# Patient Record
Sex: Male | Born: 1970 | Race: Black or African American | Hispanic: No | Marital: Single | Smoking: Current every day smoker
Health system: Southern US, Community
[De-identification: ages and names within clinical notes are randomized; demographics above are authoritative.]

---

## 1999-09-22 ENCOUNTER — Emergency Department (HOSPITAL_COMMUNITY): Admission: EM | Admit: 1999-09-22 | Discharge: 1999-09-22 | Payer: Self-pay | Admitting: Emergency Medicine

## 2007-03-27 ENCOUNTER — Inpatient Hospital Stay (HOSPITAL_COMMUNITY): Admission: EM | Admit: 2007-03-27 | Discharge: 2007-03-28 | Payer: Self-pay | Admitting: Emergency Medicine

## 2010-01-19 ENCOUNTER — Ambulatory Visit: Payer: Self-pay | Admitting: Internal Medicine

## 2010-04-06 ENCOUNTER — Ambulatory Visit: Payer: Self-pay | Admitting: Family Medicine

## 2010-04-06 ENCOUNTER — Encounter (INDEPENDENT_AMBULATORY_CARE_PROVIDER_SITE_OTHER): Payer: Self-pay | Admitting: Adult Health

## 2010-04-06 LAB — CONVERTED CEMR LAB
Amphetamine Screen, Ur: NEGATIVE
Benzodiazepines.: NEGATIVE
Cocaine Metabolites: NEGATIVE
Marijuana Metabolite: POSITIVE — AB
Methadone: NEGATIVE

## 2010-04-20 ENCOUNTER — Ambulatory Visit: Payer: Self-pay | Admitting: Internal Medicine

## 2010-04-20 ENCOUNTER — Encounter (INDEPENDENT_AMBULATORY_CARE_PROVIDER_SITE_OTHER): Payer: Self-pay | Admitting: Adult Health

## 2010-04-20 LAB — CONVERTED CEMR LAB
ALT: 15 units/L (ref 0–53)
AST: 28 units/L (ref 0–37)
Alkaline Phosphatase: 41 units/L (ref 39–117)
BUN: 17 mg/dL (ref 6–23)
Barbiturate Quant, Ur: NEGATIVE
CO2: 21 meq/L (ref 19–32)
Chloride: 108 meq/L (ref 96–112)
Creatinine, Ser: 0.94 mg/dL (ref 0.40–1.50)
Creatinine,U: 206.3 mg/dL
HDL: 54 mg/dL (ref >39)
LDL Cholesterol: 98 mg/dL (ref 0–99)
Methadone: NEGATIVE
Opiate Screen, Urine: NEGATIVE
Propoxyphene: NEGATIVE
Sodium: 139 meq/L (ref 135–145)
Testosterone: 520.34 ng/dL (ref 350–890)
Total CHOL/HDL Ratio: 3.1

## 2011-05-10 NOTE — H&P (Signed)
NAMEDONTAVION, NOXON NO.:  [id number]   MEDICAL RECORD NO.:  [id number]          PATIENT TYPE:  EMS   LOCATION:  [facility]JO                         FACILITY:  [facility]   PHYSICIAN:  Lonia Blood, M.D.DATE OF BIRTH:  1971-06-19   DATE OF ADMISSION:  03/27/2007  DATE OF DISCHARGE:                              HISTORY & PHYSICAL   PRIMARY CARE PHYSICIAN:  Unassigned.   CHIEF COMPLAINT:  Chest pain with shortness of breath.   HISTORY OF PRESENT ILLNESS:  [name] is an otherwise  healthy 40 year old gentleman who admits to occasional cocaine abuse,  occasional marijuana abuse, and a one pack per week tobacco abuse  history.  He has no prior history of hospitalizations, surgeries,  diabetes, hypertension, hyperlipidemia.  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  While carrying a load of logs, he  began to experience left sided pressure type chest pain.  This radiated  down the left shoulder into the arm and elbow.  It was associated with  shortness of breath and diaphoresis.  He stopped what he was doing and  his symptoms resolved.  He admitted to using cocaine approximately 12  hours prior to this.  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  This was associated with diaphoresis and shortness of  breath.  The patient states his symptoms resolved.  He called his  grandmother, described his symptoms, and she explained to him that he  needed to report to the emergency room for evaluation.  In the emergency  room, he has been pain free.  Point-of-care cardiac markers have been  negative x1.  EKG is non-acute with early repolarization changes.   REVIEW OF SYSTEMS:  A comprehensive review of systems is unremarkable  with the exception of positive elements noted in the  history of present  illness above.   PAST MEDICAL HISTORY:  1. Tobacco abuse in the amount of one pack per week since age 65.  2. Marijuana abuse in the amount of one joint per week on average.  3. Cocaine abuse in the amount of approximately 1/2 inch line snorted      every month intermittently.   MEDICATIONS:  None.   ALLERGIES:  NO KNOWN DRUG ALLERGIES.   FAMILY HISTORY:  The patient's father is alive and well but has  diabetes.  The patient's mother deceased at an early age secondary to  lupus.  The patient has a brother and a sister, neither of which have  coronary disease.   SOCIAL HISTORY:  The patient does not drink alcohol, lives in  [place], has one healthy child, is single and works as a Administrator.  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   DATA REVIEW:  VITAL SIGNS:  Temperature 99.1, blood pressure 118/58,  heart rate 67, respiratory rate 16, O2 sat is 98% on room air.  Basic  metabolic panel was unremarkable.  Point-of-care cardiac  markers x1 are  negative.  Chest x-ray reveals no acute disease.  A 12-lead EKG reveals  normal sinus rhythm at 65 beats per minute with evidence of early  repolarization abnormalities.   PHYSICAL EXAMINATION:  VITAL SIGNS:  Temperature 99.1, blood pressure  118/58, heart rate 67, respiratory rate 16, O2 sat is 98% on room air.  GENERAL:  Well-developed, well-nourished male in no acute respiratory  distress.  HEENT:  Normocephalic atraumatic.  Pupils are equal, round, and reactive  to light and accommodation.  Extraocular muscles intact bilaterally.  OC/OP clear.  NECK:  No JVD.  LUNGS:  Clear to auscultation bilaterally without wheezing or rhonchi.  CARDIOVASCULAR:  Regular rate and rhythm without murmur, gallop or rub.  Normal S1 and S2.  ABDOMEN:  Nontender, nondistended, soft.  Bowel sounds present.  No  hepatosplenomegaly.  No rebound.  No ascites.  EXTREMITIES:  No significant cyanosis, clubbing, or edema bilateral   lower extremities.  NEUROLOGIC:  Nonfocal neurologic exam.   IMPRESSION AND PLAN:  1. Chest pain.  Symptoms are consistent with a cocaine-associated      chest pain.  I have counseled the patient extensively as to the      direct connection between cocaine abuse and his chest pain.  I have      explained to him the high risk of acute myocardial infarction or      acute stroke in the setting of ongoing cocaine abuse.  He agrees to      immediate abstinence from cocaine from this point forward.  Given      the description of his symptoms, I do feel that 24-hour observation      is appropriate.  We will cycle cardiac enzymes and check serial      EKGs.  If his rule out is negative, I do not feel that he has      sufficient risk factors apart from cocaine to necessitate further      risk stratification.  2. Tobacco abuse.  I have counseled the patient extensively of the      multiple deleterious effects of ongoing tobacco abuse.  He agrees      to discontinue smoking.  We will request tobacco cessation      consultation to further encourage this point.  3. Marijuana abuse.  In kind, I have advised the patient that      marijuana abuse is bad for his health in multiple different ways.      I have advised him to discontinue its use.  He reports that he will      do so immediately.      Lonia Blood, M.D.  Electronically Signed     JTM/MEDQ  D:  03/27/2007  T:  03/27/2007  Job:  [id number]

## 2012-01-20 NOTE — FLOWSHEETS (Signed)
°

## 2012-05-01 ENCOUNTER — Emergency Department (HOSPITAL_COMMUNITY): Payer: Self-pay

## 2012-05-01 ENCOUNTER — Encounter (HOSPITAL_COMMUNITY): Payer: Self-pay | Admitting: Emergency Medicine

## 2012-05-01 ENCOUNTER — Emergency Department (HOSPITAL_COMMUNITY)
Admission: EM | Admit: 2012-05-01 | Discharge: 2012-05-01 | Disposition: A | Discharge status: Home / Self Care | Payer: Self-pay | Attending: Emergency Medicine | Admitting: Emergency Medicine

## 2012-05-01 DIAGNOSIS — Y9289 Other specified places as the place of occurrence of the external cause: Secondary | ICD-10-CM | POA: Insufficient documentation

## 2012-05-01 DIAGNOSIS — S93402A Sprain of unspecified ligament of left ankle, initial encounter: Secondary | ICD-10-CM

## 2012-05-01 DIAGNOSIS — S93409A Sprain of unspecified ligament of unspecified ankle, initial encounter: Secondary | ICD-10-CM | POA: Insufficient documentation

## 2012-05-01 DIAGNOSIS — X500XXA Overexertion from strenuous movement or load, initial encounter: Secondary | ICD-10-CM | POA: Insufficient documentation

## 2012-05-01 MED ORDER — IBUPROFEN 800 MG PO TABS: 800.0000 mg | ORAL_TABLET | Freq: Three times a day (TID) | ORAL | Status: AC

## 2012-05-01 MED ORDER — HYDROCODONE-ACETAMINOPHEN 5-500 MG PO TABS: 1.0000 | ORAL_TABLET | Freq: Four times a day (QID) | ORAL | Status: AC | PRN

## 2012-05-01 MED ORDER — OXYCODONE-ACETAMINOPHEN 5-325 MG PO TABS
2.0000 | ORAL_TABLET | Freq: Once | ORAL | Status: AC
  Administered 2012-05-01: 2 via ORAL
  Filled 2012-05-01: qty 2

## 2012-05-01 NOTE — ED Notes (Signed)
Pt slipped off of curb yesterday while trying to catch a bus and twisted left ankle. He reports "I heard it pop" and "it went flat on the ground" while pointing to ankle. Left foot and ankle pain reported. 9/10.

## 2012-05-01 NOTE — Discharge Instructions (Signed)
Wear ankle brace for at least 2 weeks for stabilization of ankle. Use crutches as needed for comfort. Ice and elevate ankle throughout the day. Alternate between ibuprofen and hydrocodone-acetaminophen for pain relief. Do not drive or operate machinery with hydrocodone-acetaminophen use. Call orthopedic follow up today or tomorrow to schedule followup appointment for recheck of ongoing ankle pain in the next 1-2 weeks that can be canceled with a 24-48 hour notice if complete resolution of pain.  Ankle Sprain An ankle sprain is an injury to the strong, fibrous tissues (ligaments) that hold the bones of your ankle joint together.  CAUSES Ankle sprain usually is caused by a fall or by twisting your ankle. People who participate in sports are more prone to these types of injuries.  SYMPTOMS  Symptoms of ankle sprain include:  Pain in your ankle. The pain may be present at rest or only when you are trying to stand or walk.   Swelling.   Bruising. Bruising may develop immediately or within 1 to 2 days after your injury.   Difficulty standing or walking.  DIAGNOSIS  Your caregiver will ask you details about your injury and perform a physical exam of your ankle to determine if you have an ankle sprain. During the physical exam, your caregiver will press and squeeze specific areas of your foot and ankle. Your caregiver will try to move your ankle in certain ways. An X-ray exam may be done to be sure a bone was not broken or a ligament did not separate from one of the bones in your ankle (avulsion).  TREATMENT  Certain types of braces can help stabilize your ankle. Your caregiver can make a recommendation for this. Your caregiver may recommend the use of medication for pain. If your sprain is severe, your caregiver may refer you to a surgeon who helps to restore function to parts of your skeletal system (orthopedist) or a physical therapist. HOME CARE INSTRUCTIONS  Apply ice to your injury for 1 to 2 days  or as directed by your caregiver. Applying ice helps to reduce inflammation and pain.  Put ice in a plastic bag.   Place a towel between your skin and the bag.   Leave the ice on for 15 to 20 minutes at a time, every 2 hours while you are awake.   Take over-the-counter or prescription medicines for pain, discomfort, or fever only as directed by your caregiver.   Keep your injured leg elevated, when possible, to lessen swelling.   If your caregiver recommends crutches, use them as instructed. Gradually, put weight on the affected ankle. Continue to use crutches or a cane until you can walk without feeling pain in your ankle.   If you have a plaster splint, wear the splint as directed by your caregiver. Do not rest it on anything harder than a pillow the first 24 hours. Do not put weight on it. Do not get it wet. You may take it off to take a shower or bath.   You may have been given an elastic bandage to wear around your ankle to provide support. If the elastic bandage is too tight (you have numbness or tingling in your foot or your foot becomes cold and blue), adjust the bandage to make it comfortable.   If you have an air splint, you may blow more air into it or let air out to make it more comfortable. You may take your splint off at night and before taking a shower or bath.  Wiggle your toes in the splint several times per day if you are able.  SEEK MEDICAL CARE IF:   You have an increase in bruising, swelling, or pain.   Your toes feel cold.   Pain relief is not achieved with medication.  SEEK IMMEDIATE MEDICAL CARE IF: Your toes are numb or blue or you have severe pain. MAKE SURE YOU:   Understand these instructions.   Will watch your condition.   Will get help right away if you are not doing well or get worse.  Document Released: 12/09/2005 Document Revised: 11/28/2011 Document Reviewed: 07/13/2008 [facility] Patient Information 2012 [place], [place].

## 2012-05-01 NOTE — ED Provider Notes (Signed)
History     CSN: [id number]  Arrival date & time 05/01/12  1228   First MD Initiated Contact with Patient 05/01/12 1236      No chief complaint on file.   (Consider location/radiation/quality/duration/timing/severity/associated sxs/prior treatment) HPI  Patient presents to ER complaining of left ankle injury that happened yesterday when he twisted ankle stepping off a curb and heard a "pop". Patient states that since injury increasing swelling and pain with pain aggravated by weight bearing. Denies additional injury or break in skin. patient has taken nothing for pain PTA. Denies extremity numbness or tingling. Patient states he has no known medical problems and takes no meds on regular basis.   History reviewed. No pertinent past medical history.  History reviewed. No pertinent past surgical history.  No family history on file.  History  Substance Use Topics  . Smoking status: Current Everyday Smoker  . Smokeless tobacco: Not on file  . Alcohol Use: Yes      Review of Systems  Musculoskeletal: Positive for joint swelling.  Skin: Negative for color change and wound.  Neurological: Negative for numbness.    Allergies  Review of patient's allergies indicates no known allergies.  Home Medications   Current Outpatient Rx  Name Route Sig Dispense Refill  . ASPIRIN EC 81 MG PO TBEC Oral Take 243 mg by mouth once.    Marland Kitchen HYDROCODONE-ACETAMINOPHEN 5-500 MG PO TABS Oral Take 1-2 tablets by mouth every 6 (six) hours as needed for pain. 15 tablet 0  . IBUPROFEN 800 MG PO TABS Oral Take 1 tablet (800 mg total) by mouth 3 (three) times daily. Take 800mg  by mouth at breakfast, lunch and dinner for the next 5 days 21 tablet 0    BP 118/73  Temp 98.8 F (37.1 C)  Resp 18  SpO2 98%  Physical Exam  Nursing note and vitals reviewed. Constitutional: He is oriented to person, place, and time. He appears well-developed.  HENT:  Head: Normocephalic and atraumatic.  Eyes:  Conjunctivae are normal.  Neck: Normal range of motion. Neck supple.  Cardiovascular: Normal rate.   Pulmonary/Chest: Effort normal.  Musculoskeletal: He exhibits edema.       TTP of entire ankle with soft tissue swelling of ankle and decreased ROM due to pain. No breaks in skin. Good pedal pulse and cap refill. No TTP of forefoot or toes. Normal sensation of entire LLE. No TTP of calf or knee.   Neurological: He is alert and oriented to person, place, and time.  Skin: Skin is warm and dry. No rash noted. No erythema. No pallor.    ED Course  Procedures (including critical care time)  PO percocet  Labs Reviewed - No data to display Dg Ankle Complete Left  05/01/2012  *RADIOLOGY REPORT*  Clinical Data: Injury  LEFT ANKLE COMPLETE - 3+ VIEW  Comparison: None.  Findings: Prominent soft tissue swelling over the lateral malleolus.  Small bony density adjacent to the medial malleolus is present compatible with a avulsion fracture of indeterminate age. Degenerative changes in the ankle joint.  IMPRESSION: Soft tissue swelling over the lateral malleolus.  Small avulsion fracture from the medial malleolus of indeterminate age.  Original Report Authenticated By: Donavan Burnet, M.D.     1. Left ankle sprain     ASO and crutches given by ortho tech   MDM  LLE neuro vasc intact with no acute findings on ankle xray but likely sprain given mechanism of injury. Good pedal pulse. Entire forefoot  non tender. Patient to follow up with ortho as needed. Denies additional injury.         Jenness Corner, [place] 05/01/12 1520

## 2012-05-04 NOTE — ED Provider Notes (Signed)
Medical screening examination/treatment/procedure(s) were performed by non-physician practitioner and as supervising physician I was immediately available for consultation/collaboration.  Geoffery Lyons, MD 05/04/12 [phone]

## 2013-05-02 ENCOUNTER — Encounter (HOSPITAL_COMMUNITY): Payer: Self-pay | Admitting: *Deleted

## 2013-05-02 ENCOUNTER — Emergency Department (HOSPITAL_COMMUNITY)
Admission: EM | Admit: 2013-05-02 | Discharge: 2013-05-02 | Disposition: A | Discharge status: Home / Self Care | Payer: Self-pay | Attending: Emergency Medicine | Admitting: Emergency Medicine

## 2013-05-02 DIAGNOSIS — S61209A Unspecified open wound of unspecified finger without damage to nail, initial encounter: Secondary | ICD-10-CM | POA: Insufficient documentation

## 2013-05-02 DIAGNOSIS — Z23 Encounter for immunization: Secondary | ICD-10-CM | POA: Insufficient documentation

## 2013-05-02 DIAGNOSIS — S61012A Laceration without foreign body of left thumb without damage to nail, initial encounter: Secondary | ICD-10-CM

## 2013-05-02 DIAGNOSIS — W268XXA Contact with other sharp object(s), not elsewhere classified, initial encounter: Secondary | ICD-10-CM | POA: Insufficient documentation

## 2013-05-02 DIAGNOSIS — F17200 Nicotine dependence, unspecified, uncomplicated: Secondary | ICD-10-CM | POA: Insufficient documentation

## 2013-05-02 DIAGNOSIS — Y9389 Activity, other specified: Secondary | ICD-10-CM | POA: Insufficient documentation

## 2013-05-02 DIAGNOSIS — Y9289 Other specified places as the place of occurrence of the external cause: Secondary | ICD-10-CM | POA: Insufficient documentation

## 2013-05-02 DIAGNOSIS — Y990 Civilian activity done for income or pay: Secondary | ICD-10-CM | POA: Insufficient documentation

## 2013-05-02 MED ORDER — TETANUS-DIPHTH-ACELL PERTUSSIS 5-2.5-18.5 LF-MCG/0.5 IM SUSP
0.5000 mL | Freq: Once | INTRAMUSCULAR | Status: AC
  Administered 2013-05-02: 0.5 mL via INTRAMUSCULAR
  Filled 2013-05-02: qty 0.5

## 2013-05-02 NOTE — ED Notes (Signed)
Pt c/o LAC to left thumb approximately 1 inch and 1/4. Cut by broken glass bottle. Last tetanus greater than 5 years. Can wiggle digit, sensation present, cap refill less than 2 seconds, skin warm.

## 2013-05-02 NOTE — ED Notes (Signed)
The pt has a 1" laceration to the lt thumb when he lifted a plastic bag at workj that had a broken bottle inside it .  The bottle came through the bag and lacerated his finger and he cannot get the bleeding to stop.  Laceration cleaned with soap and water.  He can flex and extend his thumb without difficulty

## 2013-05-02 NOTE — ED Provider Notes (Signed)
History     CSN: [id number]  Arrival date & time 05/02/13  0124   First MD Initiated Contact with Patient 05/02/13 0136      Chief Complaint  Patient presents with   Extremity Laceration   HPI  History provided by the patient. Patient is a 42 year old male with no significant PMH who presents with laceration to his left thumb. Patient was at work emptying trash bags at [employer] and while tying the bag his left thumb hit a broken beer bottle causing a laceration. Patient had immediate bleeding and pain. Pain is slightly improved however he continues to have bleeding. He rinsed the cut with soap and water. He also used some Band-Aids over the thumb without significant improvement. Denies any other injuries or complaints. There is no weakness or numbness to the thumb. Denies any reduced range of motion. he is unsure of his last tetanus.    History reviewed. No pertinent past medical history.  History reviewed. No pertinent past surgical history.  No family history on file.  History  Substance Use Topics   Smoking status: Current Every Day Smoker   Smokeless tobacco: Not on file   Alcohol Use: Yes      Review of Systems  Neurological: Negative for weakness and numbness.  All other systems reviewed and are negative.    Allergies  Review of patient's allergies indicates no known allergies.  Home Medications   Current Outpatient Rx  Name  Route  Sig  Dispense  Refill   ibuprofen (ADVIL,MOTRIN) 200 MG tablet   Oral   Take 200 mg by mouth every 6 (six) hours as needed for pain.           BP 130/89   Pulse 62   Temp(Src) 97.8 F (36.6 C)   Resp 18   SpO2 99%  Physical Exam  Nursing note and vitals reviewed. Constitutional: He is oriented to person, place, and time. He appears well-developed and well-nourished. No distress.  HENT:  Head: Normocephalic.  Cardiovascular: Normal rate and regular rhythm.   Pulmonary/Chest: Effort normal and breath sounds  normal.  Musculoskeletal: Normal range of motion.  Superficial laceration with flap over the dorsal left thumb. No deep structure or tendon involvement through full range of motion. There is normal strength against resistance in all directions. Normal medial and lateral sensations to the distal tip of the finger. Normal cap refill less than 2 seconds.  Neurological: He is alert and oriented to person, place, and time.  Skin: Skin is warm.  Psychiatric: He has a normal mood and affect. His behavior is normal.    ED Course  Procedures   LACERATION REPAIR Performed by: Angus Seller Authorized by: Angus Seller Consent: Verbal consent obtained. Risks and benefits: risks, benefits and alternatives were discussed Consent given by: patient Patient identity confirmed: provided demographic data Prepped and Draped in normal sterile fashion Wound explored  Laceration Location: Left thumb  Laceration Length: 2.5 cm  No Foreign Bodies seen or palpated  Anesthesia: local infiltration  Local anesthetic: lidocaine 2% without epinephrine  Anesthetic total: 4 ml  Irrigation method: syringe Amount of cleaning: standard  Skin closure: Skin with 4-0 Prolene   Number of sutures: 3   Technique: Simple interrupted   Patient tolerance: Patient tolerated the procedure well with no immediate complications.     1. Laceration of thumb, left, initial encounter       MDM  Patient seen and evaluated. Patient well-appearing no acute distress. Laceration superficial  without deep structure involvement. Wounds cleaned and closed with sutures. Tetanus given.        Angus Seller, PA-C 05/02/13 [phone]

## 2013-05-03 NOTE — ED Provider Notes (Signed)
Medical screening examination/treatment/procedure(s) were performed by non-physician practitioner and as supervising physician I was immediately available for consultation/collaboration.   Laray Anger, DO 05/03/13 [phone]

## 2016-10-03 ENCOUNTER — Emergency Department (HOSPITAL_COMMUNITY): Payer: Self-pay

## 2016-10-03 ENCOUNTER — Encounter (HOSPITAL_COMMUNITY): Payer: Self-pay | Admitting: Emergency Medicine

## 2016-10-03 ENCOUNTER — Emergency Department (HOSPITAL_COMMUNITY)
Admission: EM | Admit: 2016-10-03 | Discharge: 2016-10-03 | Disposition: A | Discharge status: Home / Self Care | Payer: Self-pay | Attending: Emergency Medicine | Admitting: Emergency Medicine

## 2016-10-03 DIAGNOSIS — N5089 Other specified disorders of the male genital organs: Secondary | ICD-10-CM

## 2016-10-03 DIAGNOSIS — F17200 Nicotine dependence, unspecified, uncomplicated: Secondary | ICD-10-CM | POA: Insufficient documentation

## 2016-10-03 DIAGNOSIS — Z79899 Other long term (current) drug therapy: Secondary | ICD-10-CM | POA: Insufficient documentation

## 2016-10-03 DIAGNOSIS — N451 Epididymitis: Secondary | ICD-10-CM | POA: Insufficient documentation

## 2016-10-03 LAB — URINE MICROSCOPIC-ADD ON: RBC / HPF: NONE SEEN RBC/hpf (ref 0–5)

## 2016-10-03 LAB — URINALYSIS, ROUTINE W REFLEX MICROSCOPIC
Bilirubin Urine: NEGATIVE
Glucose, UA: NEGATIVE mg/dL
Hgb urine dipstick: NEGATIVE
KETONES UR: NEGATIVE mg/dL
NITRITE: NEGATIVE
PH: 6 (ref 5.0–8.0)
Protein, ur: NEGATIVE mg/dL
SPECIFIC GRAVITY, URINE: 1.027 (ref 1.005–1.030)

## 2016-10-03 MED ORDER — GI COCKTAIL ~~LOC~~: 30.0000 mL | Freq: Once | ORAL | Status: DC

## 2016-10-03 MED ORDER — DOXYCYCLINE HYCLATE 100 MG PO CAPS: 100.0000 mg | ORAL_CAPSULE | Freq: Two times a day (BID) | ORAL | 0 refills | Status: DC

## 2016-10-03 MED ORDER — STERILE WATER FOR INJECTION IJ SOLN
INTRAMUSCULAR | Status: AC
  Administered 2016-10-03: 10 mL
  Filled 2016-10-03: qty 10

## 2016-10-03 MED ORDER — DOXYCYCLINE HYCLATE 100 MG PO TABS
100.0000 mg | ORAL_TABLET | Freq: Once | ORAL | Status: AC
  Administered 2016-10-03: 100 mg via ORAL
  Filled 2016-10-03: qty 1

## 2016-10-03 MED ORDER — CEFTRIAXONE SODIUM 250 MG IJ SOLR
250.0000 mg | Freq: Once | INTRAMUSCULAR | Status: AC
  Administered 2016-10-03: 250 mg via INTRAMUSCULAR
  Filled 2016-10-03: qty 250

## 2016-10-03 NOTE — Discharge Instructions (Signed)
Please take all of your antibiotics until finished!  Use a condom with every sexual encounter Follow up with your doctor in regards to today's visit.   Please return to the ER for worsening symptoms, high fevers or persistent vomiting.  You have been tested for chlamydia and gonorrhea. These results will be available in approximately 3 days. You will be notified if they are positive.    SEEK IMMEDIATE MEDICAL CARE IF:  You develop an oral temperature above 102 F (38.9 C), not controlled by medications or lasting more than 2 days.  You develop an increase in pain.

## 2016-10-03 NOTE — ED Triage Notes (Signed)
Pt reports unprotected sexual intercourse. sts his condom broke. Reports left testicle pain radiating to left groin and lower abdomen. Reports also white penile discharge, dysuria.

## 2016-10-03 NOTE — ED Notes (Signed)
[name] at bedside
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
## 2016-10-04 LAB — GC/CHLAMYDIA PROBE AMP (~~LOC~~) NOT AT ARMC
CHLAMYDIA, DNA PROBE: NEGATIVE
Neisseria Gonorrhea: NEGATIVE

## 2017-03-13 ENCOUNTER — Encounter (HOSPITAL_COMMUNITY): Payer: Self-pay | Admitting: Emergency Medicine

## 2017-03-13 ENCOUNTER — Ambulatory Visit (HOSPITAL_COMMUNITY)
Admission: EM | Admit: 2017-03-13 | Discharge: 2017-03-13 | Disposition: A | Discharge status: Home / Self Care | Payer: Self-pay | Attending: Family Medicine | Admitting: Family Medicine

## 2017-03-13 DIAGNOSIS — A084 Viral intestinal infection, unspecified: Secondary | ICD-10-CM

## 2017-03-13 DIAGNOSIS — B9789 Other viral agents as the cause of diseases classified elsewhere: Secondary | ICD-10-CM

## 2017-03-13 DIAGNOSIS — J069 Acute upper respiratory infection, unspecified: Secondary | ICD-10-CM

## 2017-03-13 MED ORDER — ONDANSETRON 4 MG PO TBDP
4.0000 mg | ORAL_TABLET | Freq: Once | ORAL | Status: AC
  Administered 2017-03-13: 4 mg via ORAL

## 2017-03-13 MED ORDER — BENZONATATE 100 MG PO CAPS: 100.0000 mg | ORAL_CAPSULE | Freq: Three times a day (TID) | ORAL | 0 refills | Status: DC

## 2017-03-13 MED ORDER — ONDANSETRON 4 MG PO TBDP
ORAL_TABLET | ORAL | Status: AC
  Filled 2017-03-13: qty 1

## 2017-03-13 MED ORDER — IPRATROPIUM BROMIDE 0.06 % NA SOLN: 2.0000 | Freq: Four times a day (QID) | NASAL | 0 refills | Status: DC

## 2017-03-13 MED ORDER — ONDANSETRON 8 MG PO TBDP: 8.0000 mg | ORAL_TABLET | Freq: Three times a day (TID) | ORAL | 0 refills | Status: DC | PRN

## 2017-03-13 NOTE — ED Triage Notes (Signed)
Here for diarrhea onset 2 days associated w/vomiting  Also c/o cold sx onset yest associated w/nasal congestion/drainage, cough  Denies fevers  A&O x4... NAD

## 2017-03-13 NOTE — ED Provider Notes (Signed)
CSN: [id number]     Arrival date & time 03/13/17  1316 History   None    Chief Complaint  Patient presents with  . URI  . Diarrhea   (Consider location/radiation/quality/duration/timing/severity/associated sxs/prior Treatment) Patient c/o uri sx's and diarrhea.  Patient states he is also having nausea.  He c/o sinus congestion and cough for 2 days.     The history is provided by the patient.  URI  Presenting symptoms: congestion, cough and fatigue   Severity:  Moderate Onset quality:  Sudden Duration:  2 days Timing:  Constant Progression:  Worsening Chronicity:  New Relieved by:  None tried Worsened by:  Nothing Ineffective treatments:  None tried Diarrhea  Associated symptoms: URI     History reviewed. No pertinent past medical history. History reviewed. No pertinent surgical history. History reviewed. No pertinent family history. Social History  Substance Use Topics  . Smoking status: Current Every Day Smoker  . Smokeless tobacco: Never Used  . Alcohol use Yes    Review of Systems  Constitutional: Positive for fatigue.  HENT: Positive for congestion.   Eyes: Negative.   Respiratory: Positive for cough.   Cardiovascular: Negative.   Gastrointestinal: Positive for diarrhea.  Endocrine: Negative.   Genitourinary: Negative.   Musculoskeletal: Negative.   Skin: Negative.   Allergic/Immunologic: Negative.   Neurological: Negative.   Hematological: Negative.   Psychiatric/Behavioral: Negative.     Allergies  Patient has no known allergies.  Home Medications   Prior to Admission medications   Medication Sig Start Date End Date Taking? Authorizing Provider  benzonatate (TESSALON) 100 MG capsule Take 1 capsule (100 mg total) by mouth every 8 (eight) hours. 03/13/17   Deatra Canter, FNP  doxycycline (VIBRAMYCIN) 100 MG capsule Take 1 capsule (100 mg total) by mouth 2 (two) times daily. 10/03/16   Jaime Pilcher Ward, PA-C  ibuprofen (ADVIL,MOTRIN) 200 MG  tablet Take 200 mg by mouth every 6 (six) hours as needed for pain.    Historical Provider, MD  ipratropium (ATROVENT) 0.06 % nasal spray Place 2 sprays into both nostrils 4 (four) times daily. 03/13/17   Deatra Canter, FNP  ondansetron (ZOFRAN ODT) 8 MG disintegrating tablet Take 1 tablet (8 mg total) by mouth every 8 (eight) hours as needed for nausea or vomiting. 03/13/17   Deatra Canter, FNP   Meds Ordered and Administered this Visit   Medications  ondansetron (ZOFRAN-ODT) disintegrating tablet 4 mg (4 mg Oral Given 03/13/17 1428)    BP (!) 182/95 (BP Location: Left Arm)   Pulse 77   Temp 98.4 F (36.9 C) (Oral)   Resp 16   SpO2 97%  No data found.   Physical Exam  Constitutional: He appears well-developed and well-nourished.  HENT:  Head: Normocephalic and atraumatic.  Right Ear: External ear normal.  Left Ear: External ear normal.  Mouth/Throat: Oropharynx is clear and moist.  Eyes: Conjunctivae and EOM are normal. Pupils are equal, round, and reactive to light.  Neck: Normal range of motion. Neck supple.  Cardiovascular: Normal rate, regular rhythm and normal heart sounds.   Pulmonary/Chest: Effort normal and breath sounds normal.  Abdominal: Soft. Bowel sounds are normal.  Nursing note and vitals reviewed.   Urgent Care Course     Procedures (including critical care time)  Labs Review Labs Reviewed - No data to display  Imaging Review No results found.   Visual Acuity Review  Right Eye Distance:   Left Eye Distance:   Bilateral Distance:  Right Eye Near:   Left Eye Near:    Bilateral Near:         MDM   1. Viral gastroenteritis   2. Viral URI with cough    Zpak Tessalon Perles Atrovent nasal spray Zofran 4mg  here and Zofran ODT8mg  po tid prn #21  Push po fluids, rest, tylenol and motrin otc prn as directed for fever, arthralgias, and myalgias.  Follow up prn if sx's continue or persist.    Deatra Canter, FNP 03/13/17 1446

## 2018-02-07 ENCOUNTER — Ambulatory Visit (HOSPITAL_COMMUNITY)
Admission: EM | Admit: 2018-02-07 | Discharge: 2018-02-07 | Disposition: A | Discharge status: Home / Self Care | Payer: Self-pay | Attending: Family Medicine | Admitting: Family Medicine

## 2018-02-07 ENCOUNTER — Encounter (HOSPITAL_COMMUNITY): Payer: Self-pay | Admitting: Emergency Medicine

## 2018-02-07 DIAGNOSIS — M940 Chondrocostal junction syndrome [Tietze]: Secondary | ICD-10-CM

## 2018-02-07 DIAGNOSIS — M542 Cervicalgia: Secondary | ICD-10-CM

## 2018-02-07 DIAGNOSIS — M792 Neuralgia and neuritis, unspecified: Secondary | ICD-10-CM

## 2018-02-07 MED ORDER — PREDNISONE 10 MG (21) PO TBPK: ORAL_TABLET | Freq: Every day | ORAL | 0 refills | Status: DC

## 2018-02-07 MED ORDER — TRAMADOL HCL 50 MG PO TABS: 50.0000 mg | ORAL_TABLET | Freq: Four times a day (QID) | ORAL | 0 refills | Status: DC | PRN

## 2018-02-07 MED ORDER — DICLOFENAC SODIUM 75 MG PO TBEC: 75.0000 mg | DELAYED_RELEASE_TABLET | Freq: Two times a day (BID) | ORAL | 0 refills | Status: DC

## 2018-02-07 NOTE — ED Triage Notes (Signed)
PT C/O: left shoulder pain onset x2 months and radiates down left arm   TAKING MEDS: Ibuprofen w/temp relief.   A&O x4... NAD... Ambulatory

## 2018-02-07 NOTE — Discharge Instructions (Signed)
Be aware, pain medications may cause drowsiness. Please do not drive, operate heavy machinery or make important decisions while on this medication, it can cloud your judgement.  

## 2018-02-07 NOTE — ED Provider Notes (Signed)
°  [facility]   [id number] 02/07/18 Arrival Time: 1612  ASSESSMENT & PLAN:  1. Costochondritis   2. Neck discomfort   3. Radicular pain in left arm     Meds ordered this encounter  Medications   diclofenac (VOLTAREN) 75 MG EC tablet    Sig: Take 1 tablet (75 mg total) by mouth 2 (two) times daily.    Dispense:  14 tablet    Refill:  0   predniSONE (STERAPRED UNI-PAK 21 TAB) 10 MG (21) TBPK tablet    Sig: Take by mouth daily. Take as directed.    Dispense:  21 tablet    Refill:  0   traMADol (ULTRAM) 50 MG tablet    Sig: Take 1 tablet (50 mg total) by mouth every 6 (six) hours as needed.    Dispense:  15 tablet    Refill:  0   [place] Controlled Substances Registry consulted for this patient. I feel the risk/benefit ratio today is favorable for proceeding with this prescription for a controlled substance. Medication sedation precautions given.  If not improving he will schedule orthopaedic f/u.  Reviewed expectations re: course of current medical issues. Questions answered. Outlined signs and symptoms indicating need for more acute intervention. Patient verbalized understanding. After Visit Summary given.  SUBJECTIVE: History from: patient. Rosanne AshingRodney M Pettitt is a 47 y.o. male who reports localized mild to moderate pain of his neck and left shoulder that is stable; intermittent; described as shooting with radiation to his left arm at times. Onset: gradual, 2 months ago. Injury/trama: unsure; none reported. Relieved by: nothing in particular; "just randomly happens". Worsened by: certain movements. Associated symptoms: none reported. Extremity sensation changes or weakness: occasional mild tingling in L hand. Self treatment: ibuprofen with mild help.  History reviewed. No pertinent surgical history.  ROS: As per HPI.   OBJECTIVE:  Vitals:   02/07/18 1639  BP: 140/79  Pulse: 63  Resp: 16  Temp: 98 F (36.7 C)  TempSrc: Oral  SpO2: 100%    General  appearance: alert; no distress Extremities: no cyanosis or edema; symmetrical with no gross deformities; poorly localized tenderness over left lateral neck muscles without midline tenderness; some anterior shoulder tenderness; FROM of neck and shoulder with mild discomfort CV: normal extremity capillary refill Skin: warm and dry Neurologic: normal gait; normal symmetric reflexes in all extremities; normal sensation in all extremities Psychological: alert and cooperative; normal mood and affect  No Known Allergies   Social History   Socioeconomic History   Marital status: Single    Spouse name: Not on file   Number of children: Not on file   Years of education: Not on file   Highest education level: Not on file  Social Needs   Financial resource strain: Not on file   Food insecurity - worry: Not on file   Food insecurity - inability: Not on file   Transportation needs - medical: Not on file   Transportation needs - non-medical: Not on file  Occupational History   Not on file  Tobacco Use   Smoking status: Current Every Day Smoker   Smokeless tobacco: Never Used  Substance and Sexual Activity   Alcohol use: Yes   Drug use: Not on file   Sexual activity: Not on file  Other Topics Concern   Not on file  Social History Narrative   Not on file       Mardella LaymanHagler, Advaith Lamarque, MD 02/12/18 [phone]

## 2018-07-18 IMAGING — US US SCROTUM
1 series · 14 of 25 positions shown · non-contrast
Comparison: None.

CLINICAL DATA: Scrotal swelling.

EXAM:
ULTRASOUND OF SCROTUM
TECHNIQUE: Complete ultrasound examination of the testicles, epididymis, and
other scrotal structures was performed.

[Series 1: us scrotum · 0.06mm/px · 14 of 73 slices shown]
[im 1/73]
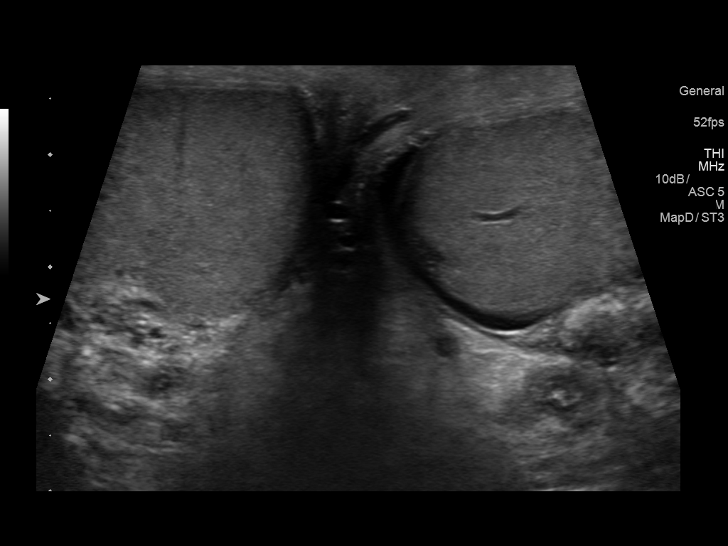
[im 7/73]
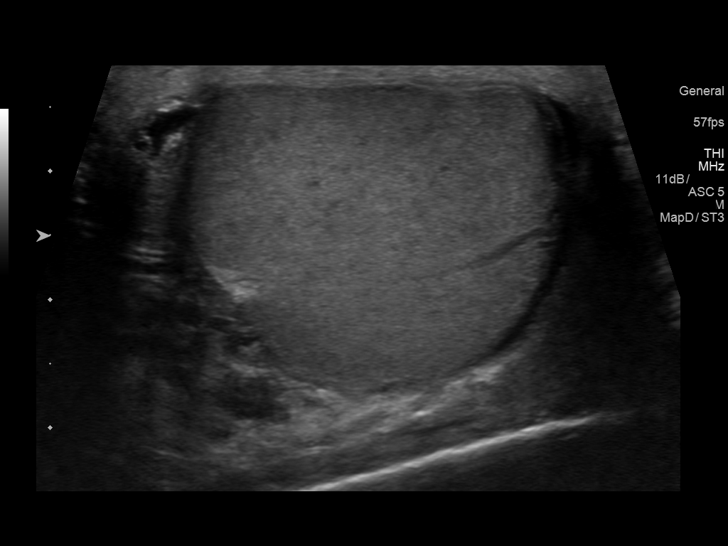
[im 13/73]
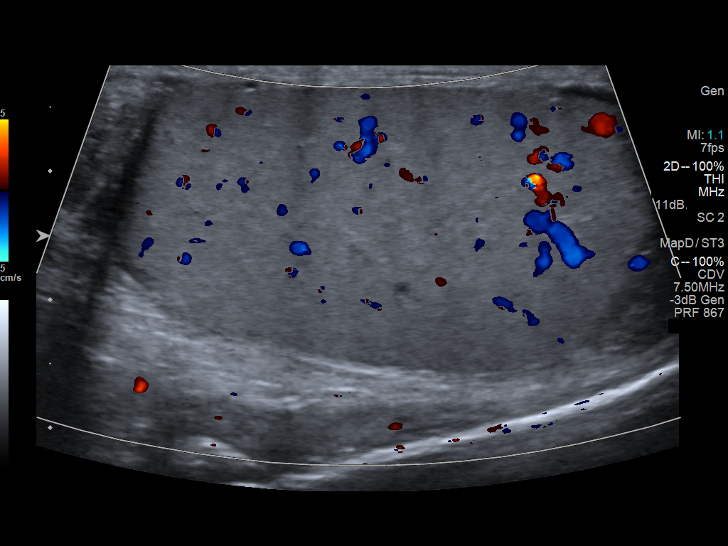
[im 19/73]
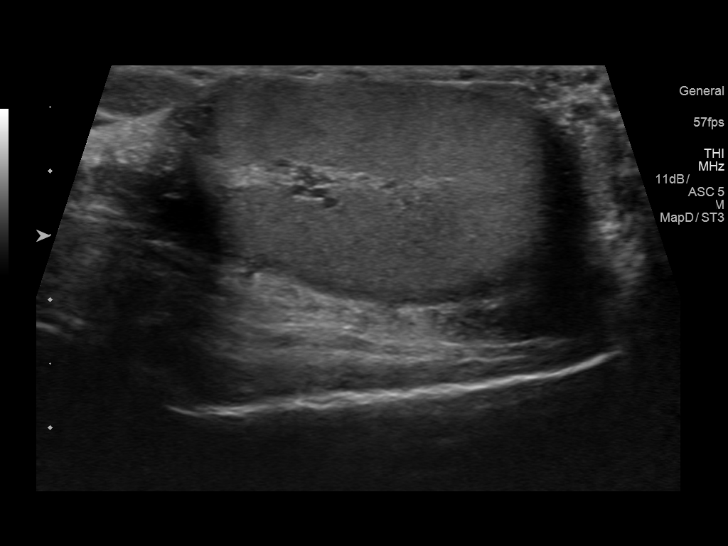
[im 25/73]
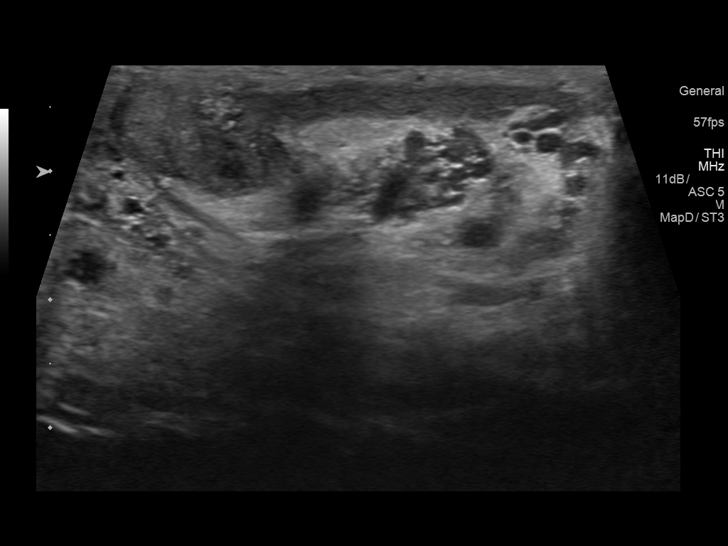
[im 28/73]
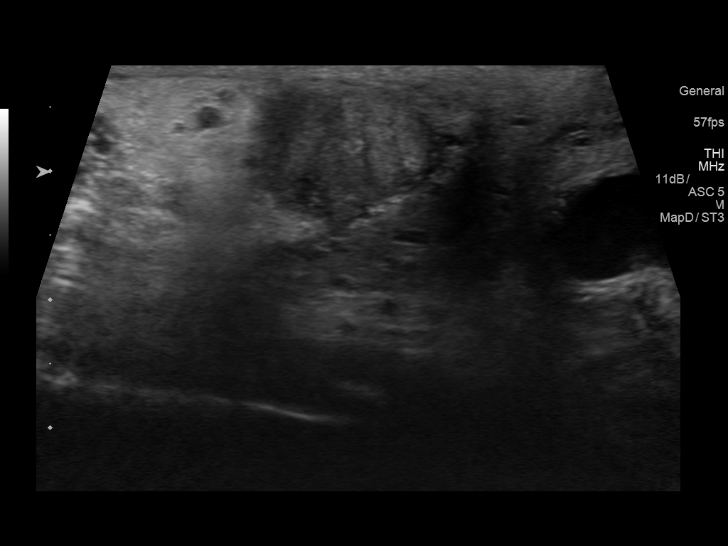
[im 34/73]
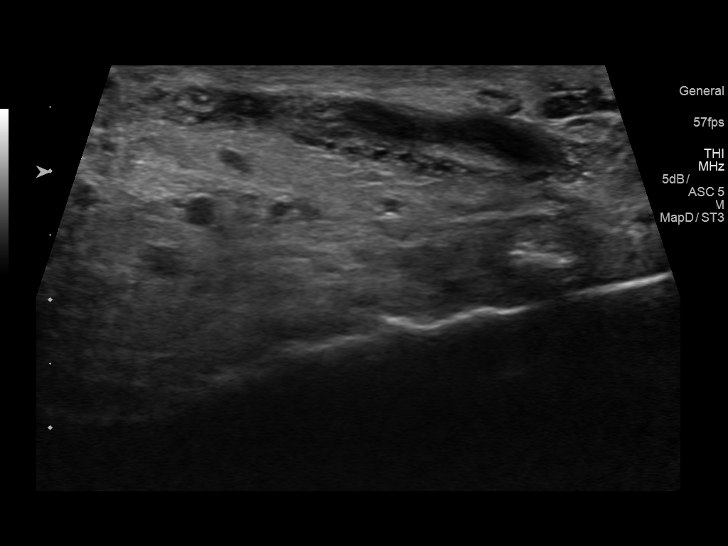
[im 40/73]
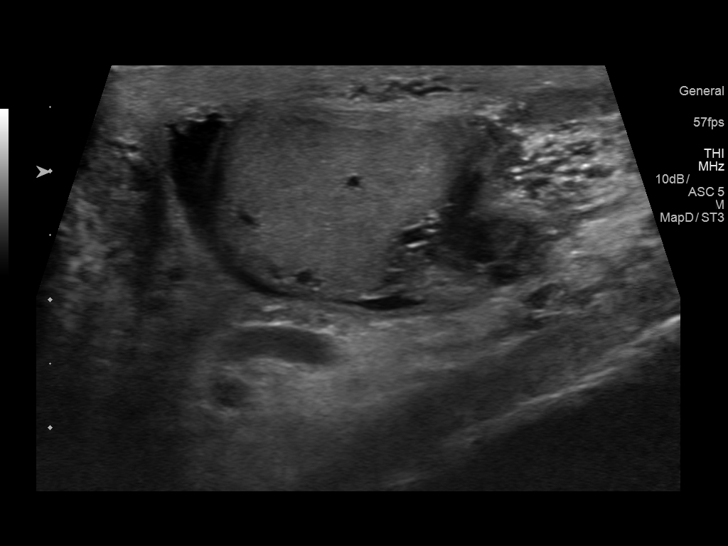
[im 46/73]
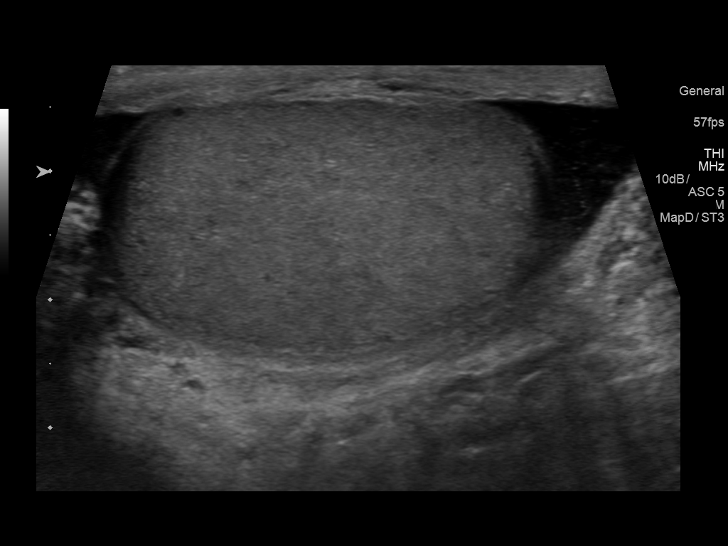
[im 49/73]
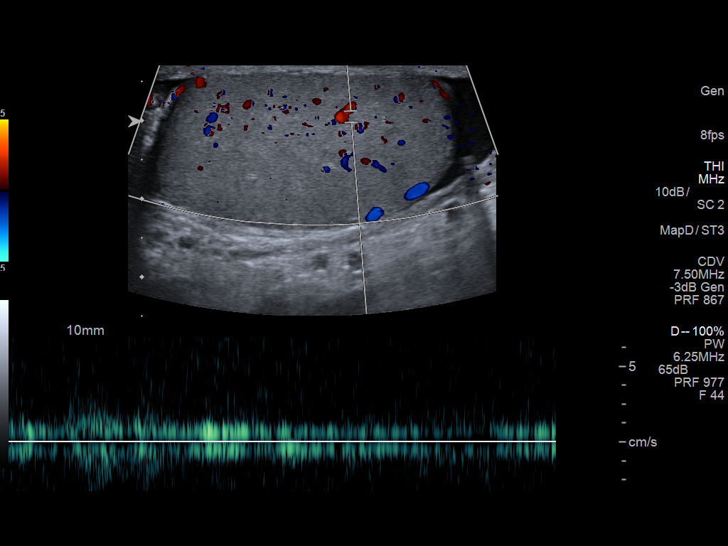
[im 55/73]
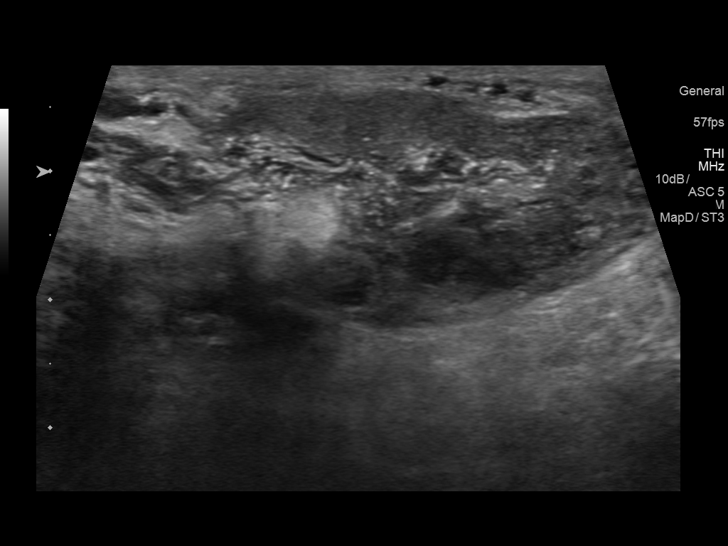
[im 61/73]
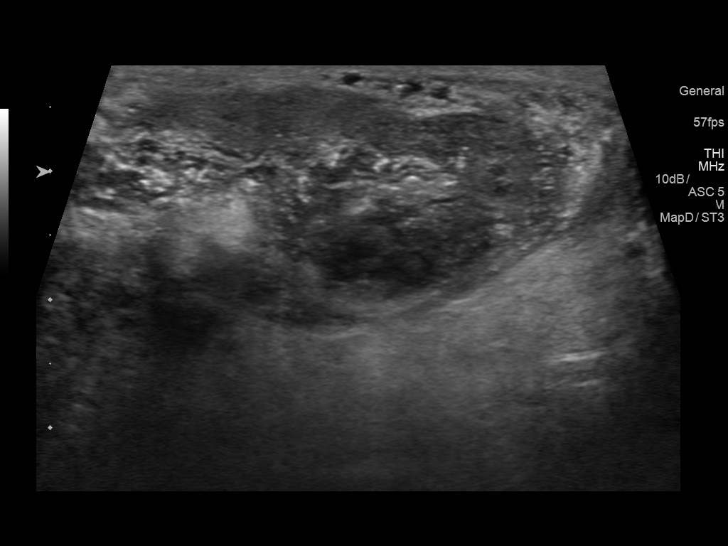
[im 67/73]
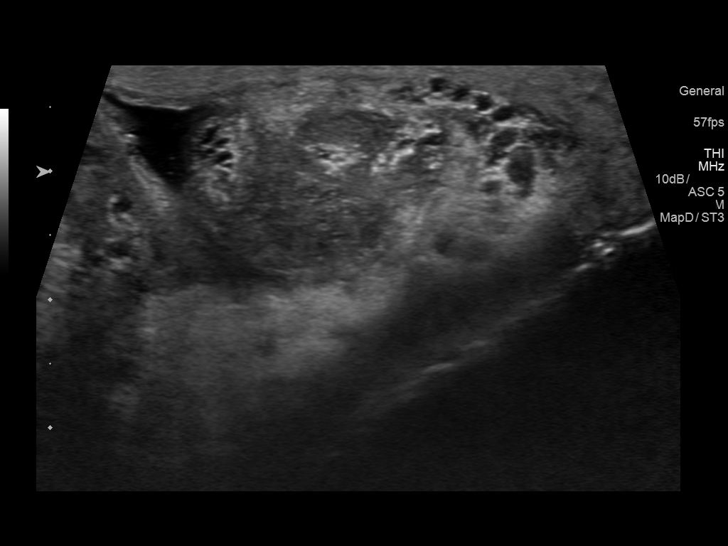
[im 73/73]
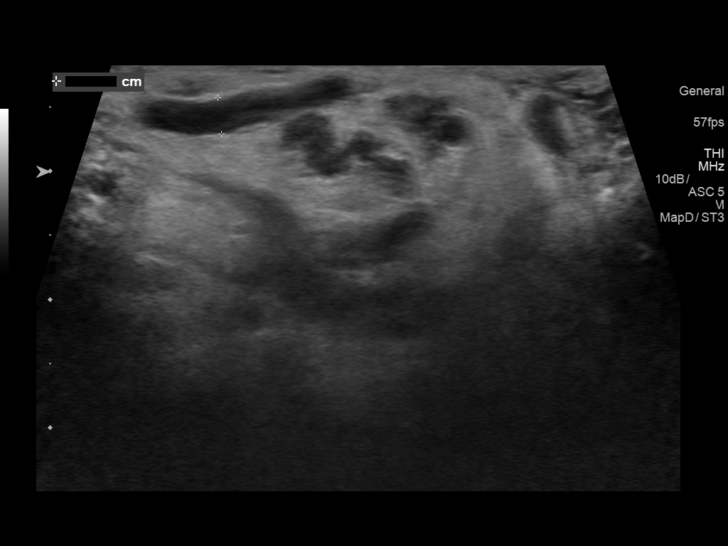

[14 of 25 positions shown; findings below may reference images not displayed]

FINDINGS: Right testicle

Measurements: 4.3 x 2.0 x 2.9 cm. No mass or microlithiasis
visualized.

Left testicle

Measurements: 4.1 x 2.0 x 2.8 cm. No mass or microlithiasis
visualized.

Right epididymis:  Normal in size and appearance.

Left epididymis: Enlargement identified in the tail of the left
epididymis with hypervascularity on color Doppler assessment.

Hydrocele:  None visualized.

Varicocele: Borderline left varicocele noted with Valsalva maneuver.
IMPRESSION: Hypervascularity and enlargement in the tail of the left epididymis
suggests epididymitis.

Borderline left varicocele.

## 2021-09-24 ENCOUNTER — Observation Stay (HOSPITAL_COMMUNITY)
Admission: EM | Admit: 2021-09-24 | Discharge: 2021-09-24 | Disposition: A | Discharge status: Home / Self Care | Payer: Self-pay | Attending: General Surgery | Admitting: General Surgery

## 2021-09-24 ENCOUNTER — Emergency Department (HOSPITAL_COMMUNITY): Payer: Self-pay

## 2021-09-24 DIAGNOSIS — S31139A Puncture wound of abdominal wall without foreign body, unspecified quadrant without penetration into peritoneal cavity, initial encounter: Principal | ICD-10-CM | POA: Insufficient documentation

## 2021-09-24 DIAGNOSIS — S31119A Laceration without foreign body of abdominal wall, unspecified quadrant without penetration into peritoneal cavity, initial encounter: Secondary | ICD-10-CM

## 2021-09-24 DIAGNOSIS — Z20822 Contact with and (suspected) exposure to covid-19: Secondary | ICD-10-CM | POA: Insufficient documentation

## 2021-09-24 DIAGNOSIS — R109 Unspecified abdominal pain: Secondary | ICD-10-CM | POA: Insufficient documentation

## 2021-09-24 LAB — COMPREHENSIVE METABOLIC PANEL
ALT: 34 U/L (ref 0–44)
AST: 38 U/L (ref 15–41)
Albumin: 3.9 g/dL (ref 3.5–5.0)
Alkaline Phosphatase: 89 U/L (ref 38–126)
Anion gap: 11 (ref 5–15)
BUN: 6 mg/dL (ref 6–20)
CO2: 23 mmol/L (ref 22–32)
Calcium: 8.4 mg/dL — ABNORMAL LOW (ref 8.9–10.3)
Chloride: 107 mmol/L (ref 98–111)
Creatinine, Ser: 1.04 mg/dL (ref 0.61–1.24)
GFR, Estimated: >60 mL/min (ref >60)
Glucose, Bld: 87 mg/dL (ref 70–99)
Potassium: 3.3 mmol/L — ABNORMAL LOW (ref 3.5–5.1)
Sodium: 141 mmol/L (ref 135–145)
Total Bilirubin: 1.3 mg/dL — ABNORMAL HIGH (ref 0.3–1.2)
Total Protein: 6.9 g/dL (ref 6.5–8.1)

## 2021-09-24 LAB — CBC WITH DIFFERENTIAL/PLATELET
Abs Immature Granulocytes: 0.02 10*3/uL (ref 0.00–0.07)
Basophils Absolute: 0 10*3/uL (ref 0.0–0.1)
Basophils Relative: 0 %
Eosinophils Absolute: 0.1 10*3/uL (ref 0.0–0.5)
Eosinophils Relative: 1 %
HCT: 42.3 % (ref 39.0–52.0)
Hemoglobin: 13.8 g/dL (ref 13.0–17.0)
Immature Granulocytes: 0 %
Lymphocytes Relative: 22 %
Lymphs Abs: 1.8 10*3/uL (ref 0.7–4.0)
MCH: 30.5 pg (ref 26.0–34.0)
MCHC: 32.6 g/dL (ref 30.0–36.0)
MCV: 93.4 fL (ref 80.0–100.0)
Monocytes Absolute: 0.6 10*3/uL (ref 0.1–1.0)
Monocytes Relative: 8 %
Neutro Abs: 5.8 10*3/uL (ref 1.7–7.7)
Neutrophils Relative %: 69 %
Platelets: 223 10*3/uL (ref 150–400)
RBC: 4.53 MIL/uL (ref 4.22–5.81)
RDW: 13.7 % (ref 11.5–15.5)
WBC: 8.4 10*3/uL (ref 4.0–10.5)
nRBC: 0 % (ref 0.0–0.2)

## 2021-09-24 LAB — I-STAT CHEM 8, ED
BUN: 7 mg/dL (ref 6–20)
Calcium, Ion: 1.03 mmol/L — ABNORMAL LOW (ref 1.15–1.40)
Chloride: 106 mmol/L (ref 98–111)
Creatinine, Ser: 1.2 mg/dL (ref 0.61–1.24)
Glucose, Bld: 85 mg/dL (ref 70–99)
HCT: 45 % (ref 39.0–52.0)
Hemoglobin: 15.3 g/dL (ref 13.0–17.0)
Potassium: 3.4 mmol/L — ABNORMAL LOW (ref 3.5–5.1)
Sodium: 144 mmol/L (ref 135–145)
TCO2: 24 mmol/L (ref 22–32)

## 2021-09-24 LAB — RESP PANEL BY RT-PCR (FLU A&B, COVID) ARPGX2
Influenza A by PCR: NEGATIVE
Influenza B by PCR: NEGATIVE
SARS Coronavirus 2 by RT PCR: NEGATIVE

## 2021-09-24 MED ORDER — HYDRALAZINE HCL 20 MG/ML IJ SOLN: 10.0000 mg | INTRAMUSCULAR | Status: DC | PRN

## 2021-09-24 MED ORDER — IBUPROFEN 400 MG PO TABS: 600.0000 mg | ORAL_TABLET | Freq: Four times a day (QID) | ORAL | Status: DC | PRN

## 2021-09-24 MED ORDER — ACETAMINOPHEN 325 MG PO TABS
650.0000 mg | ORAL_TABLET | ORAL | Status: DC | PRN
  Administered 2021-09-24: 650 mg via ORAL
  Filled 2021-09-24: qty 2

## 2021-09-24 MED ORDER — IOHEXOL 300 MG/ML  SOLN
100.0000 mL | Freq: Once | INTRAMUSCULAR | Status: AC | PRN
  Administered 2021-09-24: 100 mL via INTRAVENOUS

## 2021-09-24 MED ORDER — ONDANSETRON HCL 4 MG/2ML IJ SOLN: 4.0000 mg | Freq: Four times a day (QID) | INTRAMUSCULAR | Status: DC | PRN

## 2021-09-24 MED ORDER — ONDANSETRON 4 MG PO TBDP: 4.0000 mg | ORAL_TABLET | Freq: Four times a day (QID) | ORAL | Status: DC | PRN

## 2021-09-24 MED ORDER — DOCUSATE SODIUM 100 MG PO CAPS
100.0000 mg | ORAL_CAPSULE | Freq: Two times a day (BID) | ORAL | Status: DC
  Administered 2021-09-24: 100 mg via ORAL
  Filled 2021-09-24: qty 1

## 2021-09-24 MED ORDER — TRAMADOL HCL 50 MG PO TABS: 50.0000 mg | ORAL_TABLET | Freq: Four times a day (QID) | ORAL | Status: DC | PRN
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
## 2021-09-24 NOTE — ED Notes (Signed)
Pt transported back to rm 
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
## 2021-09-24 NOTE — ED Notes (Signed)
Pt provided sandwich bag

## 2021-09-24 NOTE — ED Notes (Signed)
Pt trasnported to CT 
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
## 2021-09-24 NOTE — ED Notes (Signed)
Pt continuing not to tell his name
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
## 2024-12-10 ENCOUNTER — Ambulatory Visit (HOSPITAL_COMMUNITY)
Admission: EM | Admit: 2024-12-10 | Discharge: 2024-12-10 | Disposition: A | Discharge status: Home / Self Care | Attending: Emergency Medicine | Admitting: Emergency Medicine

## 2024-12-10 ENCOUNTER — Ambulatory Visit (HOSPITAL_COMMUNITY)

## 2024-12-10 ENCOUNTER — Ambulatory Visit (HOSPITAL_COMMUNITY): Payer: Self-pay

## 2024-12-10 DIAGNOSIS — R051 Acute cough: Secondary | ICD-10-CM

## 2024-12-10 DIAGNOSIS — R112 Nausea with vomiting, unspecified: Secondary | ICD-10-CM

## 2024-12-10 DIAGNOSIS — B349 Viral infection, unspecified: Secondary | ICD-10-CM

## 2024-12-10 DIAGNOSIS — R197 Diarrhea, unspecified: Secondary | ICD-10-CM | POA: Diagnosis not present

## 2024-12-10 MED ORDER — BENZONATATE 100 MG PO CAPS: 100.0000 mg | ORAL_CAPSULE | Freq: Three times a day (TID) | ORAL | 0 refills | Status: DC

## 2024-12-10 MED ORDER — PROMETHAZINE-DM 6.25-15 MG/5ML PO SYRP: 5.0000 mL | ORAL_SOLUTION | Freq: Every evening | ORAL | 0 refills | Status: DC | PRN

## 2024-12-10 MED ORDER — ONDANSETRON 4 MG PO TBDP: 4.0000 mg | ORAL_TABLET | Freq: Three times a day (TID) | ORAL | 0 refills | Status: DC | PRN

## 2024-12-10 MED ORDER — ONDANSETRON 4 MG PO TBDP
4.0000 mg | ORAL_TABLET | Freq: Once | ORAL | Status: AC
  Administered 2024-12-10: 4 mg via ORAL

## 2024-12-10 MED ORDER — ONDANSETRON 4 MG PO TBDP
ORAL_TABLET | ORAL | Status: AC
  Filled 2024-12-10: qty 1

## 2024-12-10 NOTE — ED Triage Notes (Signed)
 Patient presents to office for vomiting and diarrhea x 1 week.Patient states he feels weak and has back pain. Patient states he has vomited twice today.
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
## 2024-12-10 NOTE — Discharge Instructions (Signed)
 Your chest x-ray did not reveal any underlying pneumonia.  I do believe her symptoms are likely related to a viral illness. You can take Zofran  every 8 hours as needed for nausea and vomiting.  This will dissolve under your tongue. You can take Tessalon  every 8 hours as needed for cough. You can take Promethazine  DM cough syrup at bedtime as needed for cough.  This can make you drowsy so do not drive, work, or drink alcohol while taking this. Otherwise you can alternate between ibuprofen  and Tylenol  as needed for pain every 6-8 hours. Make sure you are staying hydrated and getting plenty of rest. If you continue to feel very weak, continue vomiting, develop severe abdominal pain, have persistent fevers, or passout again please seek immediate medical treatment in the emergency department. Follow-up with your primary care provider or return here as needed.

## 2024-12-30 ENCOUNTER — Ambulatory Visit: Payer: Self-pay

## 2024-12-30 ENCOUNTER — Ambulatory Visit (INDEPENDENT_AMBULATORY_CARE_PROVIDER_SITE_OTHER): Payer: Self-pay | Admitting: Pharmacist

## 2024-12-30 ENCOUNTER — Other Ambulatory Visit: Payer: Self-pay

## 2024-12-30 ENCOUNTER — Other Ambulatory Visit (HOSPITAL_COMMUNITY)
Admission: RE | Admit: 2024-12-30 | Discharge: 2024-12-30 | Disposition: A | Source: Ambulatory Visit | Attending: Internal Medicine | Admitting: Internal Medicine

## 2024-12-30 ENCOUNTER — Other Ambulatory Visit (HOSPITAL_COMMUNITY): Payer: Self-pay

## 2024-12-30 ENCOUNTER — Encounter: Payer: Self-pay | Admitting: Internal Medicine

## 2024-12-30 ENCOUNTER — Telehealth: Payer: Self-pay

## 2024-12-30 ENCOUNTER — Ambulatory Visit: Payer: Self-pay | Admitting: Internal Medicine

## 2024-12-30 VITALS — BP 135/92 | HR 80 | Temp 98.7°F | Resp 16 | Wt 180.2 lb

## 2024-12-30 DIAGNOSIS — B20 Human immunodeficiency virus [HIV] disease: Secondary | ICD-10-CM

## 2024-12-30 DIAGNOSIS — Z23 Encounter for immunization: Secondary | ICD-10-CM

## 2024-12-30 DIAGNOSIS — Z113 Encounter for screening for infections with a predominantly sexual mode of transmission: Secondary | ICD-10-CM
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
## 2024-12-30 NOTE — Telephone Encounter (Addendum)
 [facility] Patient Advocate Encounter  Insurance verification completed.    The patient is insured through [employer].     Ran test claim for SYMTUZA The current 30 day co-pay is $0. COPAY CARD PAID INSURANCE DIDN'T PUT ANYTHING TOWARDS IT   Ran test claim for DOVATO The current 30 day co-pay is $0. COPAY CARD PAID INSURANCE DIDN'T PUT ANYTHING TOWARDS IT  Ran test claim for BIKTARVY   The current 30 day co-pay is $2958.92.   Ran test claim for CABENUVA This medication will need a PA.  Also an [facility] patient per card.   We will continue to follow to see if copay assistance is needed.  This test claim was processed through [facility]- copay amounts may vary at other pharmacies due to pharmacy/plan contracts, or as the patient moves through the different stages of their insurance plan.
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
## 2024-12-31 LAB — CYTOLOGY, (ORAL, ANAL, URETHRAL) ANCILLARY ONLY
Chlamydia: NEGATIVE
Comment: NEGATIVE
Comment: NORMAL
Neisseria Gonorrhea: NEGATIVE

## 2024-12-31 LAB — T-HELPER CELL (CD4) - (RCID CLINIC ONLY)
CD4 % Helper T Cell: 18 % — ABNORMAL LOW (ref 33–65)
CD4 T Cell Abs: 468 /uL (ref 400–1790)

## 2024-12-31 LAB — URINE CYTOLOGY ANCILLARY ONLY
Chlamydia: NEGATIVE
Comment: NEGATIVE
Comment: NORMAL
Neisseria Gonorrhea: NEGATIVE

## 2024-12-31 NOTE — Progress Notes (Signed)
 NEW REFERRAL TO [facility]

## 2025-01-05 ENCOUNTER — Other Ambulatory Visit: Payer: Self-pay | Admitting: Pharmacist

## 2025-01-05 DIAGNOSIS — B20 Human immunodeficiency virus [HIV] disease: Secondary | ICD-10-CM

## 2025-01-05 MED ORDER — BIKTARVY 50-200-25 MG PO TABS: 1.0000 | ORAL_TABLET | Freq: Every day | ORAL | 0 refills | Status: DC

## 2025-01-05 NOTE — Progress Notes (Signed)
 Medication Samples have been provided to the patient.  Drug name: Biktarvy         Strength: 50/200/25 mg       Qty: 28 tablets (4 bottles)   LOT: [id number]   Exp.Date: 01/22/27  Samples requested by Dr. Luiz.  Dosing instructions: Take one tablet by mouth once daily  The patient has been instructed regarding the correct time, dose, and frequency of taking this medication, including desired effects and most common side effects.   Sammuel Blick L. Charlean Carneal, PharmD, BCIDP, AAHIVP, CPP Clinical Pharmacist Practitioner Infectious Diseases Clinical Pharmacist [facility]

## 2025-01-06 LAB — HIV-1/2 AB - DIFFERENTIATION
HIV-1 antibody: POSITIVE — AB
HIV-2 Ab: NEGATIVE

## 2025-01-06 LAB — HEPATITIS B SURFACE ANTIGEN: Hepatitis B Surface Ag: NONREACTIVE

## 2025-01-06 LAB — COMPLETE METABOLIC PANEL WITHOUT GFR
AG Ratio: 1.3 (calc) (ref 1.0–2.5)
ALT: 15 U/L (ref 9–46)
AST: 23 U/L (ref 10–35)
Albumin: 3.5 g/dL — ABNORMAL LOW (ref 3.6–5.1)
Alkaline phosphatase (APISO): 75 U/L (ref 35–144)
BUN: 15 mg/dL (ref 7–25)
CO2: 25 mmol/L (ref 20–32)
Calcium: 8.4 mg/dL — ABNORMAL LOW (ref 8.6–10.3)
Chloride: 111 mmol/L — ABNORMAL HIGH (ref 98–110)
Creat: 1.07 mg/dL (ref 0.70–1.30)
Globulin: 2.7 g/dL (ref 1.9–3.7)
Glucose, Bld: 94 mg/dL (ref 65–99)
Potassium: 4.1 mmol/L (ref 3.5–5.3)
Sodium: 141 mmol/L (ref 135–146)
Total Bilirubin: 0.7 mg/dL (ref 0.2–1.2)
Total Protein: 6.2 g/dL (ref 6.1–8.1)

## 2025-01-06 LAB — CBC WITH DIFFERENTIAL/PLATELET
Absolute Lymphocytes: 3791 {cells}/uL (ref 850–3900)
Absolute Monocytes: 731 {cells}/uL (ref 200–950)
Basophils Absolute: 111 {cells}/uL (ref 0–200)
Basophils Relative: 1.3 %
Eosinophils Absolute: 68 {cells}/uL (ref 15–500)
Eosinophils Relative: 0.8 %
HCT: 35.3 % — ABNORMAL LOW (ref 39.4–51.1)
Hemoglobin: 11.3 g/dL — ABNORMAL LOW (ref 13.2–17.1)
MCH: 29.6 pg (ref 27.0–33.0)
MCHC: 32 g/dL (ref 31.6–35.4)
MCV: 92.4 fL (ref 81.4–101.7)
MPV: 11.3 fL (ref 7.5–12.5)
Monocytes Relative: 8.6 %
Neutro Abs: 3800 {cells}/uL (ref 1500–7800)
Neutrophils Relative %: 44.7 %
Platelets: 211 Thousand/uL (ref 140–400)
RBC: 3.82 Million/uL — ABNORMAL LOW (ref 4.20–5.80)
RDW: 13.5 % (ref 11.0–15.0)
Total Lymphocyte: 44.6 %
WBC: 8.5 Thousand/uL (ref 3.8–10.8)

## 2025-01-06 LAB — HIV RNA, RTPCR W/R GT (RTI, PI,INT)
HIV 1 RNA Quant: 336000 {copies}/mL — ABNORMAL HIGH
HIV-1 RNA Quant, Log: 5.53 {Log_copies}/mL — ABNORMAL HIGH

## 2025-01-06 LAB — HEPATITIS C ANTIBODY: Hepatitis C Ab: NONREACTIVE

## 2025-01-06 LAB — HIV ANTIBODY (ROUTINE TESTING W REFLEX)
HIV 1&2 Ab, 4th Generation: REACTIVE
HIV FINAL INTERPRETATION: POSITIVE — AB

## 2025-01-06 LAB — HIV-1 INTEGRASE GENOTYPE

## 2025-01-06 LAB — HEPATITIS B SURFACE ANTIBODY,QUALITATIVE: Hep B S Ab: NONREACTIVE

## 2025-01-06 LAB — HIV-1 GENOTYPE: HIV-1 Genotype: DETECTED — AB

## 2025-01-06 LAB — HEPATITIS B CORE ANTIBODY, TOTAL: Hep B Core Total Ab: NONREACTIVE

## 2025-01-06 LAB — TEST AUTHORIZATION: TEST NAME:: 7600

## 2025-01-06 LAB — LIPID PANEL
Cholesterol: 149 mg/dL
HDL: 43 mg/dL
LDL Cholesterol (Calc): 82 mg/dL
Non-HDL Cholesterol (Calc): 106 mg/dL
Total CHOL/HDL Ratio: 3.5 (calc)
Triglycerides: 140 mg/dL

## 2025-01-06 LAB — SYPHILIS: RPR W/REFLEX TO RPR TITER AND TREPONEMAL ANTIBODIES, TRADITIONAL SCREENING AND DIAGNOSIS ALGORITHM: RPR Ser Ql: NONREACTIVE

## 2025-01-26 ENCOUNTER — Ambulatory Visit: Admitting: Internal Medicine

## 2025-01-26 ENCOUNTER — Other Ambulatory Visit: Payer: Self-pay

## 2025-01-26 DIAGNOSIS — B20 Human immunodeficiency virus [HIV] disease: Secondary | ICD-10-CM

## 2025-01-26 MED ORDER — BIKTARVY 50-200-25 MG PO TABS: 1.0000 | ORAL_TABLET | Freq: Every day | ORAL | 0 refills | Status: AC

## 2025-02-02 ENCOUNTER — Ambulatory Visit: Payer: Self-pay | Admitting: Internal Medicine
